# Patient Record
Sex: Female | Born: 1940 | Race: White | Hispanic: No
Health system: Southern US, Community
[De-identification: ages and names within clinical notes are randomized; demographics above are authoritative.]

---

## 2019-06-08 ENCOUNTER — Emergency Department (HOSPITAL_COMMUNITY): Payer: Medicare Other

## 2019-06-08 ENCOUNTER — Encounter (HOSPITAL_COMMUNITY): Payer: Self-pay

## 2019-06-08 ENCOUNTER — Other Ambulatory Visit: Payer: Self-pay

## 2019-06-08 ENCOUNTER — Emergency Department (HOSPITAL_COMMUNITY)
Admission: EM | Admit: 2019-06-08 | Discharge: 2019-06-09 | Disposition: A | Discharge status: Home / Self Care | Payer: Medicare Other | Attending: Emergency Medicine | Admitting: Emergency Medicine

## 2019-06-08 DIAGNOSIS — W1830XA Fall on same level, unspecified, initial encounter: Secondary | ICD-10-CM | POA: Insufficient documentation

## 2019-06-08 DIAGNOSIS — Y999 Unspecified external cause status: Secondary | ICD-10-CM | POA: Insufficient documentation

## 2019-06-08 DIAGNOSIS — Y92129 Unspecified place in nursing home as the place of occurrence of the external cause: Secondary | ICD-10-CM | POA: Insufficient documentation

## 2019-06-08 DIAGNOSIS — F0390 Unspecified dementia without behavioral disturbance: Secondary | ICD-10-CM | POA: Insufficient documentation

## 2019-06-08 DIAGNOSIS — Z7901 Long term (current) use of anticoagulants: Secondary | ICD-10-CM | POA: Diagnosis not present

## 2019-06-08 DIAGNOSIS — S8001XA Contusion of right knee, initial encounter: Secondary | ICD-10-CM | POA: Diagnosis not present

## 2019-06-08 DIAGNOSIS — S0001XA Abrasion of scalp, initial encounter: Secondary | ICD-10-CM | POA: Diagnosis not present

## 2019-06-08 DIAGNOSIS — Y939 Activity, unspecified: Secondary | ICD-10-CM | POA: Diagnosis not present

## 2019-06-08 DIAGNOSIS — R4182 Altered mental status, unspecified: Secondary | ICD-10-CM | POA: Insufficient documentation

## 2019-06-08 DIAGNOSIS — W19XXXA Unspecified fall, initial encounter: Secondary | ICD-10-CM

## 2019-06-08 NOTE — ED Provider Notes (Signed)
[facility] EMERGENCY DEPARTMENT Provider Note   CSN: [id number] Arrival date & time: 06/08/19  2024     History Chief Complaint  Patient presents with  . Fall  . Altered Mental Status    Lauren Hatfield is a 79 y.o. female.  Patient with history of unwitnessed fall around 7 PM, from [facility].  Transported by EMS.  Patient is on Plavix.  She has dementia.  Level 5 caveat due to dementia.  She is unable to give history as to what happened.  Reported abrasion to left posterior head.  Reported trouble talking at facility.  Placed in c-collar prior to arrival. Per daughter, patient has history of vascular dementia.         History reviewed. No pertinent past medical history.  There are no problems to display for this patient.   History reviewed. No pertinent surgical history.   OB History   No obstetric history on file.     History reviewed. No pertinent family history.  Social History   Tobacco Use  . Smoking status: Not on file  Substance Use Topics  . Alcohol use: Not on file  . Drug use: Not on file    Home Medications Prior to Admission medications   Not on File    Allergies    Patient has no allergy information on record.  Review of Systems   Review of Systems  Unable to perform ROS: Dementia    Physical Exam Updated Vital Signs BP (!) 148/100 (BP Location: Right Arm)   Pulse (!) 101   Temp 98.3 F (36.8 C) (Oral)   Resp 18   Ht 5\' 5"  (1.651 m)   Wt 52.2 kg   SpO2 93%   BMI 19.14 kg/m   Physical Exam Vitals and nursing note reviewed.  Constitutional:      Appearance: She is well-developed.  HENT:     Head: Normocephalic.     Comments: Abrasion? To R anterior tongue.     Right Ear: Tympanic membrane, ear canal and external ear normal.     Left Ear: Tympanic membrane, ear canal and external ear normal.     Nose: Nose normal.     Mouth/Throat:     Pharynx: Uvula midline.  Eyes:     General: Lids are normal.   Extraocular Movements:     Right eye: No nystagmus.     Left eye: No nystagmus.     Conjunctiva/sclera: Conjunctivae normal.     Pupils: Pupils are equal, round, and reactive to light.  Neck:     Comments: Cervical spine immobilized in c-collar. Cardiovascular:     Rate and Rhythm: Normal rate and regular rhythm.  Pulmonary:     Effort: Pulmonary effort is normal.     Breath sounds: Normal breath sounds.  Abdominal:     Palpations: Abdomen is soft.     Tenderness: There is no abdominal tenderness.  Musculoskeletal:     Right shoulder: Normal.     Left shoulder: Normal.     Right elbow: Normal.     Left elbow: Normal.     Right wrist: Normal.     Left wrist: Normal.     Cervical back: No tenderness or bony tenderness.     Right hip: Normal. Normal range of motion.     Left hip: Normal. Normal range of motion.     Right knee: Ecchymosis (mild, anterior) present.     Left knee: No ecchymosis.  Right lower leg: Normal.     Left lower leg: Normal.     Right ankle: Normal.     Left ankle: Normal.  Skin:    General: Skin is warm and dry.  Neurological:     Mental Status: She is alert and oriented to person, place, and time.     GCS: GCS eye subscore is 4. GCS verbal subscore is 5. GCS motor subscore is 6.     Cranial Nerves: No cranial nerve deficit.     Sensory: No sensory deficit.     Coordination: Coordination normal.     Deep Tendon Reflexes: Reflexes are normal and symmetric.     Comments: Patient is able to tell me her name.  Not oriented to event.  Normal speech without slurring.  She moves all extremities on command.  She sticks out her tongue on command.  There is an abrasion to the right front part of her tongue.     ED Results / Procedures / Treatments   Labs (all labs ordered are listed, but only abnormal results are displayed) Labs Reviewed  URINALYSIS, ROUTINE W REFLEX MICROSCOPIC    EKG EKG Interpretation  Date/Time:  Wednesday June 08 2019  20:56:21 EST Ventricular Rate:  92 PR Interval:    QRS Duration: 91 QT Interval:  382 QTC Calculation: 473 R Axis:   100 Text Interpretation: Sinus rhythm Inferior infarct, old No previous ECGs available Confirmed by Richardean Canal ([id number]) on 06/08/2019 9:01:38 PM   Radiology DG Chest 1 View  Result Date: 06/08/2019 CLINICAL DATA:  Fall EXAM: CHEST  1 VIEW COMPARISON:  None. FINDINGS: Cardiomegaly. No confluent opacities, effusions or edema. No acute bony abnormality. IMPRESSION: Cardiomegaly.  No acute cardiopulmonary disease. Electronically Signed   By: Charlett Nose M.D.   On: 06/08/2019 21:47   DG Pelvis 1-2 Views  Result Date: 06/08/2019 CLINICAL DATA:  Fall, pelvic pain EXAM: PELVIS - 1-2 VIEW COMPARISON:  None. FINDINGS: No acute bony abnormality. Specifically, no fracture, subluxation, or dislocation. Mild degenerative changes in the hips bilaterally. SI joints symmetric and unremarkable. IMPRESSION: No acute bony abnormality. Electronically Signed   By: Charlett Nose M.D.   On: 06/08/2019 21:48   CT Head Wo Contrast  Result Date: 06/08/2019 CLINICAL DATA:  Unwitnessed fall EXAM: CT HEAD WITHOUT CONTRAST TECHNIQUE: Contiguous axial images were obtained from the base of the skull through the vertex without intravenous contrast. COMPARISON:  None. FINDINGS: Brain: No evidence of acute territorial infarction, hemorrhage, hydrocephalus,extra-axial collection or mass lesion/mass effect. There is dilatation the ventricles and sulci consistent with age-related atrophy. Low-attenuation changes in the deep white matter consistent with small vessel ischemia. Vascular: No hyperdense vessel or unexpected calcification. Skull: The skull is intact. No fracture or focal lesion identified. Sinuses/Orbits: The visualized paranasal sinuses and mastoid air cells are clear. The orbits and globes intact. Other: None IMPRESSION: No acute intracranial abnormality. Findings consistent with age related atrophy and  chronic small vessel ischemia Electronically Signed   By: Jonna Clark M.D.   On: 06/08/2019 22:02   CT Cervical Spine Wo Contrast  Result Date: 06/08/2019 CLINICAL DATA:  Un witnessed fall, anticoagulated EXAM: CT CERVICAL SPINE WITHOUT CONTRAST TECHNIQUE: Multidetector CT imaging of the cervical spine was performed without intravenous contrast. Multiplanar CT image reconstructions were also generated. COMPARISON:  None. FINDINGS: Alignment: Alignment is anatomic. Skull base and vertebrae: No acute displaced fractures. Soft tissues and spinal canal: No prevertebral fluid or swelling. No visible canal hematoma. Disc levels:  There is extensive multilevel cervical spondylosis, most pronounced from C3/C4 through C6/C7. There is diffuse facet hypertrophy. Upper chest: Airways patent. Visualized portions of the lung apices are clear. Other: Reconstructed images demonstrate no additional findings. IMPRESSION: 1. Extensive multilevel cervical spondylosis.  No acute fractures. Electronically Signed   By: Sharlet Salina M.D.   On: 06/08/2019 21:45    Procedures Procedures (including critical care time)  Medications Ordered in ED Medications - No data to display  ED Course  I have reviewed the triage vital signs and the nursing notes.  Pertinent labs & imaging results that were available during my care of the patient were reviewed by me and considered in my medical decision making (see chart for details).  Patient seen and examined. Work-up initiated.   Vital signs reviewed and are as follows: BP (!) 148/100 (BP Location: Right Arm)   Pulse (!) 101   Temp 98.3 F (36.8 C) (Oral)   Resp 18   Ht 5\' 5"  (1.651 m)   Wt 52.2 kg   SpO2 93%   BMI 19.14 kg/m   CTs reviewed.  Dr. has seen.   Daughter now at bedside to provide additional history.  C-collar removed.  Patient is at her baseline per daughter.  She has not been able to urinate on pure wick.  Will bladder scan.  Bladder scan shows 300.   RN will ambulate to restroom.   11:19 PM Patient ambulated at baseline.  She urinated 100-200cc.  UA sent.  Likely disposition once results return.  Daughter is calling facility to ensure that it will be okay for her to bring her back due to Covid precautions.  Signout to [employer] at shift change. Plan: d/c when UA results.  Continue Plavix.    MDM Rules/Calculators/A&P                      Patient with unwitnessed fall at nursing facility.  There is question of difficulty speaking.  Patient has been normal since arrival.  No other focal neurological deficits.  Patient is at baseline per daughter at bedside.  Imaging of the head and neck, chest and pelvis were negative.  UA pending.  Anticipate discharged home.    Final Clinical Impression(s) / ED Diagnoses Final diagnoses:  Fall    Rx / DC Orders ED Discharge Orders    None       [employer], Renne Crigler 06/08/19 [id number]    [id number], MD 06/09/19 [phone]

## 2019-06-08 NOTE — ED Notes (Signed)
Daughter at bedside, is able to help in filling in pt history. States that Plavix is treatment for Vascular Dementia

## 2019-06-08 NOTE — ED Notes (Addendum)
Lab called to inform this RN that UA sample did not have label. PA to be notified, will recollect

## 2019-06-08 NOTE — ED Triage Notes (Signed)
BIB EMS for unwitnessed fall around 1900. Pt on plavix. Pt baseline dementia but verbal. Was found nonverbal after fall. Small abrasion on the rear left side of head.

## 2019-06-08 NOTE — Discharge Instructions (Signed)
Please read and follow all provided instructions.  Your diagnoses today include:  1. Fall, initial encounter   2. Fall    Tests performed today include:  CT of the head and neck - no bleeding or strokes noted in the brain  X-ray of the chest and pelvis - no broken bones  Urine test  Vital signs. See below for your results today.   Medications prescribed:   None  Take any prescribed medications only as directed.  Home care instructions:  Follow any educational materials contained in this packet.  Follow-up instructions: Please follow-up with your primary care provider in the next 3 days for further evaluation of your symptoms.   Return instructions:   Please return to the Emergency Department if you experience worsening symptoms.   Return if you have weakness in your arms or legs, slurred speech, trouble walking or talking, confusion, or trouble with your balance.   Please return if you have any other emergent concerns.  Additional Information:  Your vital signs today were: BP (!) 137/92   Pulse 84   Temp 98.3 F (36.8 C) (Oral)   Resp 20   Ht 5\' 5"  (1.651 m)   Wt 52.2 kg   SpO2 95%   BMI 19.14 kg/m  If your blood pressure (BP) was elevated above 135/85 this visit, please have this repeated by your doctor within one month. --------------

## 2019-06-08 NOTE — ED Provider Notes (Signed)
Awaiting UA  Patient UA negative.  Appears appropriate for discharge at this time   Lauren Hatfield 06/09/19 [id number]    Palumbo, April, MD 06/09/19 2309

## 2019-06-09 LAB — URINALYSIS, ROUTINE W REFLEX MICROSCOPIC
Bilirubin Urine: NEGATIVE
Glucose, UA: NEGATIVE mg/dL
Hgb urine dipstick: NEGATIVE
Ketones, ur: NEGATIVE mg/dL
Leukocytes,Ua: NEGATIVE
Nitrite: NEGATIVE
Protein, ur: NEGATIVE mg/dL
Specific Gravity, Urine: 1.014 (ref 1.005–1.030)
pH: 6 (ref 5.0–8.0)

## 2019-06-09 NOTE — ED Notes (Signed)
Pt & daughter informed of UA recollection, this RN told them that they will be updated when UA results

## 2020-07-25 ENCOUNTER — Other Ambulatory Visit: Payer: Self-pay | Admitting: *Deleted

## 2021-07-10 IMAGING — CT CT HEAD W/O CM
3 series · 16 of 40 positions shown, 19 images · non-contrast
Comparison: None.

CLINICAL DATA: Unwitnessed fall

EXAM:
CT HEAD WITHOUT CONTRAST
TECHNIQUE: Contiguous axial images were obtained from the base of the skull
through the vertex without intravenous contrast.

[Series 1: head without · axial · non-contrast · 0.42mm/px · z∈[+1310,+1400]mm · 10 of 23 slices shown, 13 images]
[im 3/23  brain]
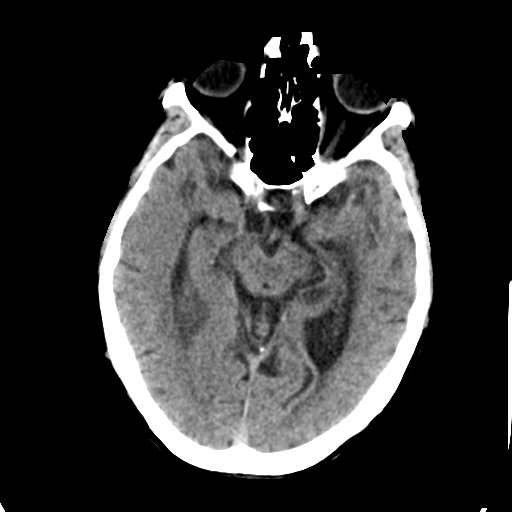
[im 3/23  bone]
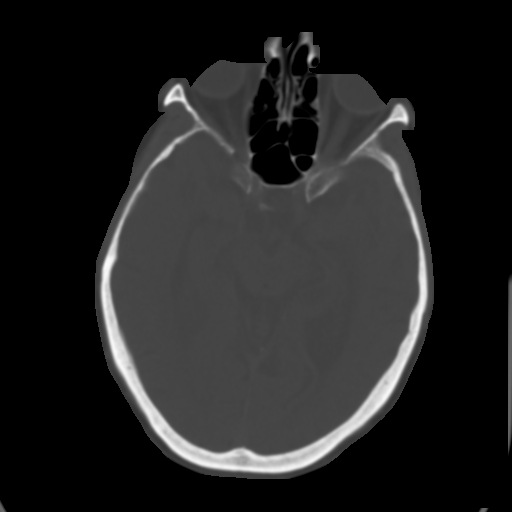
[im 5/23  brain]
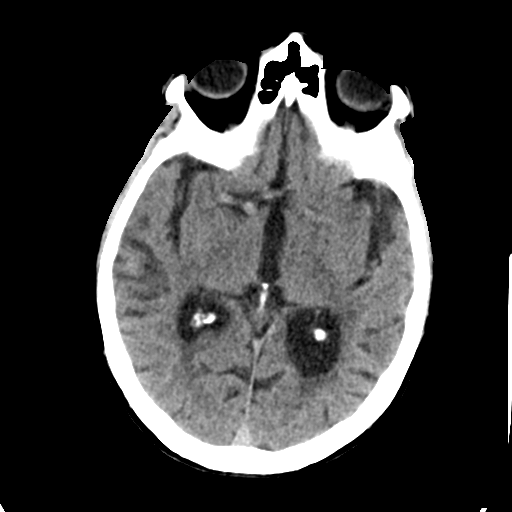
[im 7/23  brain]
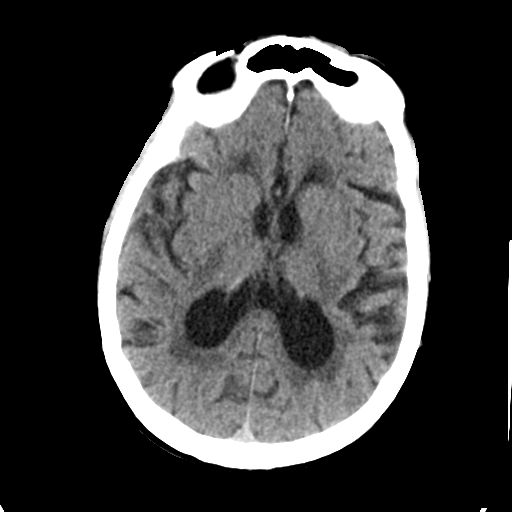
[im 9/23  brain]
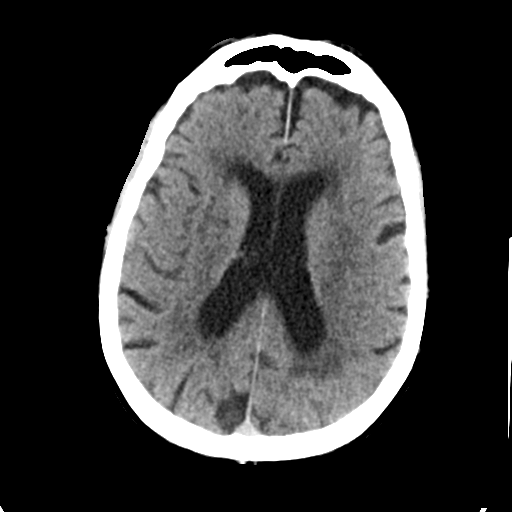
[im 11/23  brain]
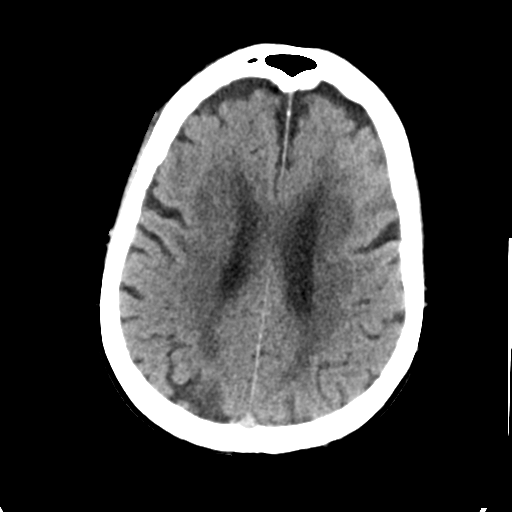
[im 11/23  bone]
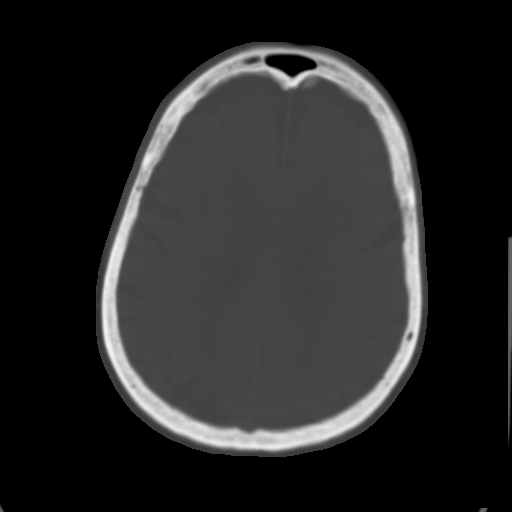
[im 13/23  brain]
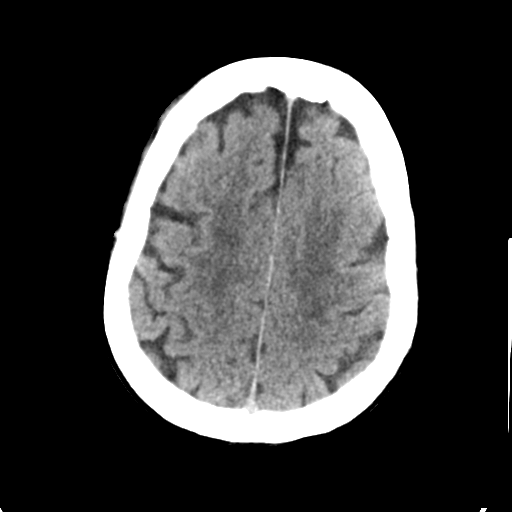
[im 15/23  brain]
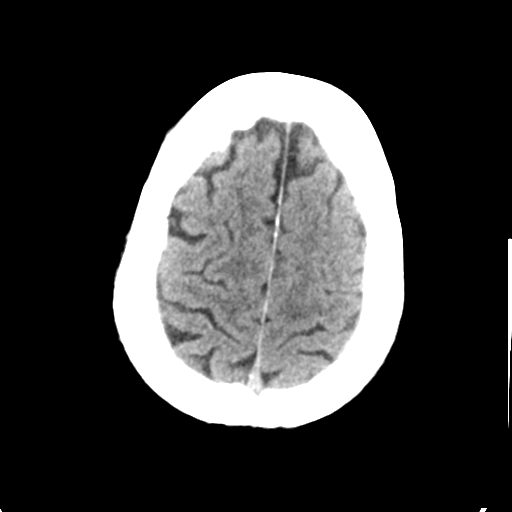
[im 17/23  brain]
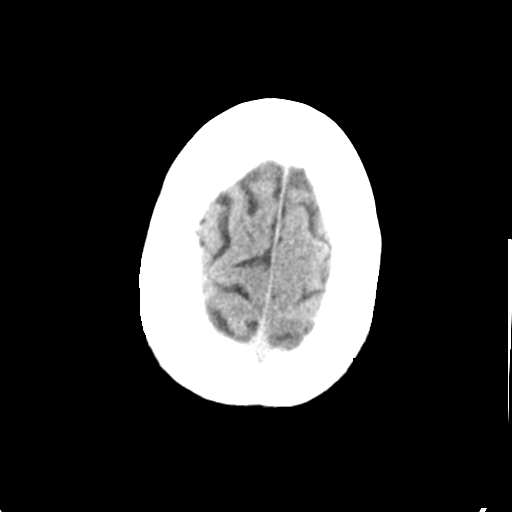
[im 19/23  brain]
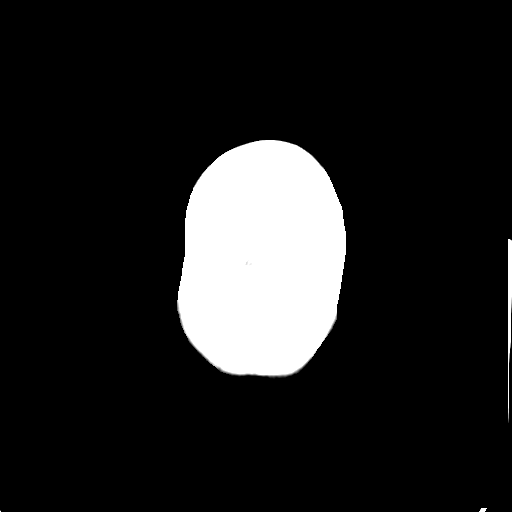
[im 19/23  bone]
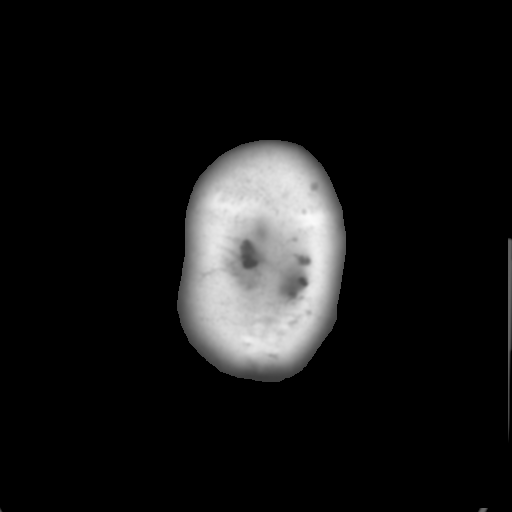
[im 21/23  brain]
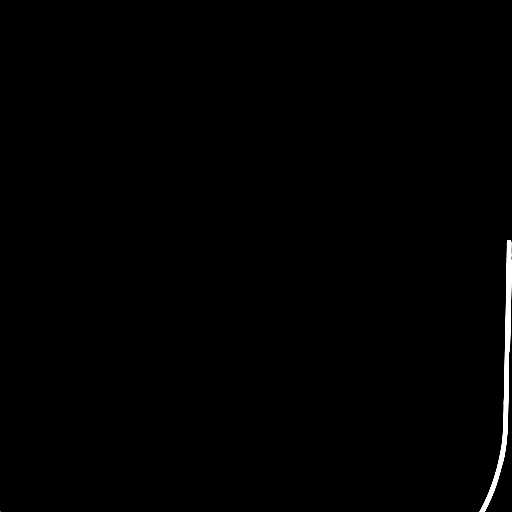

[Series 4: head without cor · coronal · non-contrast · 0.32mm/px · 3 of 67 slices shown]
[im 23/67  brain]
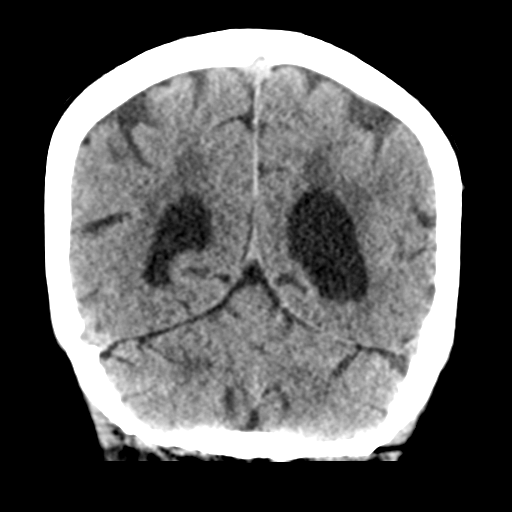
[im 30/67  brain]
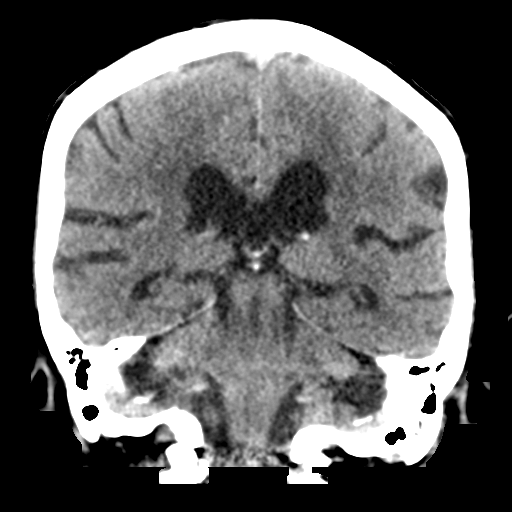
[im 37/67  brain]
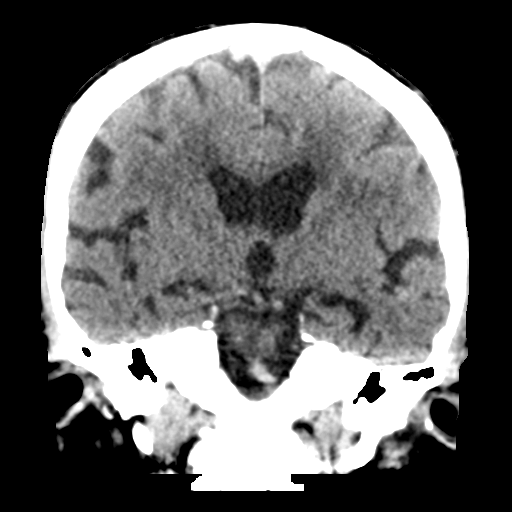

[Series 5: head without sag · sagittal · non-contrast · 0.32mm/px · 3 of 53 slices shown]
[im 18/53  brain]
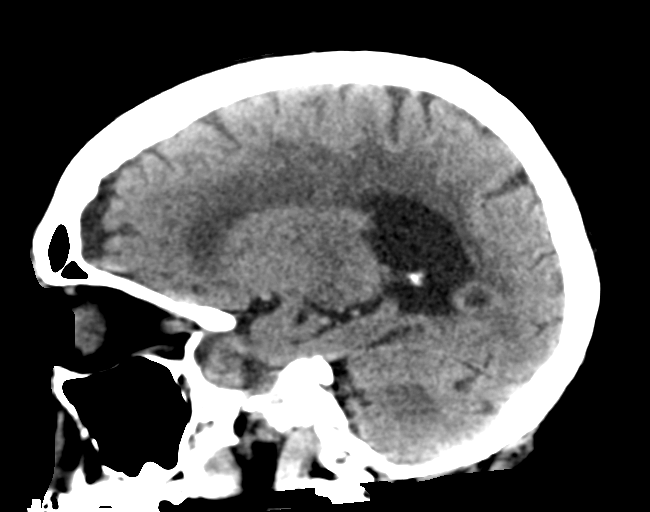
[im 27/53  brain]
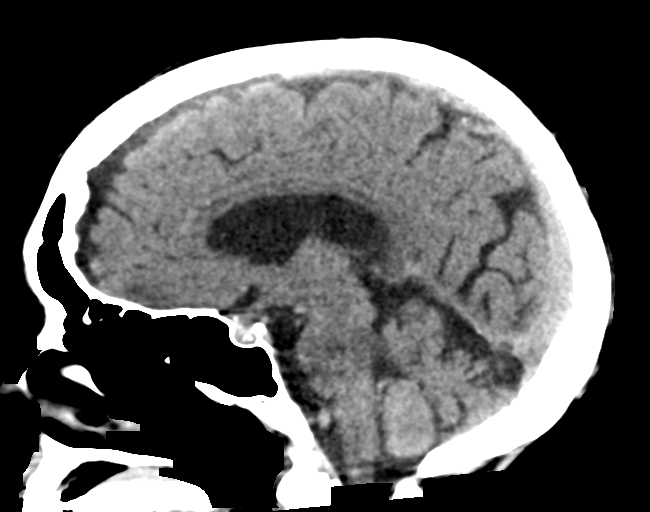
[im 35/53  brain]
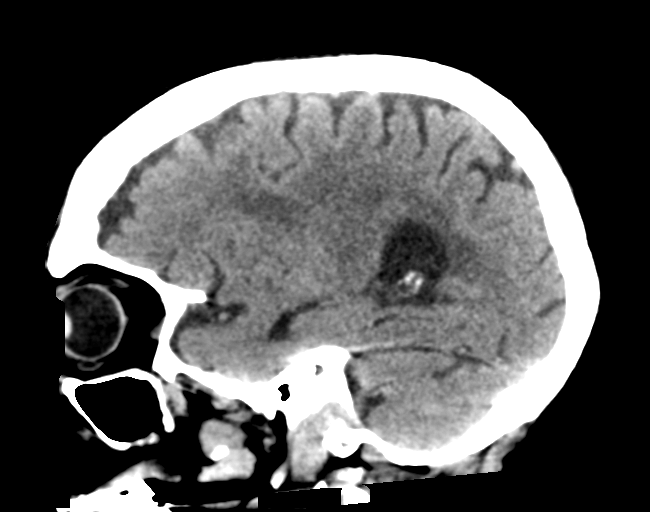

[16 of 40 positions shown; findings below may reference images not displayed]

FINDINGS: Brain: No evidence of acute territorial infarction, hemorrhage,
hydrocephalus,extra-axial collection or mass lesion/mass effect.
There is dilatation the ventricles and sulci consistent with
age-related atrophy. Low-attenuation changes in the deep white
matter consistent with small vessel ischemia.

Vascular: No hyperdense vessel or unexpected calcification.

Skull: The skull is intact. No fracture or focal lesion identified.

Sinuses/Orbits: The visualized paranasal sinuses and mastoid air
cells are clear. The orbits and globes intact.

Other: None
IMPRESSION: No acute intracranial abnormality.

Findings consistent with age related atrophy and chronic small
vessel ischemia
